# Patient Record
Sex: Male | Born: 2000
Health system: Southern US, Community
[De-identification: ages and names within clinical notes are randomized; demographics above are authoritative.]

## PROBLEM LIST (undated history)

## (undated) DIAGNOSIS — D573 Sickle-cell trait: Secondary | ICD-10-CM

## (undated) HISTORY — PX: NO PAST SURGERIES: SHX2092

## (undated) HISTORY — DX: Sickle-cell trait: D57.3

---
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
## 2022-03-05 ENCOUNTER — Encounter: Payer: 59 | Admitting: Internal Medicine

## 2022-03-10 ENCOUNTER — Encounter: Payer: Self-pay | Admitting: Internal Medicine

## 2022-03-10 ENCOUNTER — Ambulatory Visit (INDEPENDENT_AMBULATORY_CARE_PROVIDER_SITE_OTHER): Payer: 59 | Admitting: Internal Medicine

## 2022-03-10 VITALS — BP 116/84 | HR 74 | Temp 98.4°F | Resp 16 | Ht 76.0 in | Wt 250.4 lb

## 2022-03-10 DIAGNOSIS — Z114 Encounter for screening for human immunodeficiency virus [HIV]: Secondary | ICD-10-CM | POA: Diagnosis not present

## 2022-03-10 DIAGNOSIS — D573 Sickle-cell trait: Secondary | ICD-10-CM

## 2022-03-10 DIAGNOSIS — Z0000 Encounter for general adult medical examination without abnormal findings: Secondary | ICD-10-CM

## 2022-03-10 DIAGNOSIS — E669 Obesity, unspecified: Secondary | ICD-10-CM

## 2022-03-10 DIAGNOSIS — Z1159 Encounter for screening for other viral diseases: Secondary | ICD-10-CM

## 2022-03-10 NOTE — Progress Notes (Signed)
? ?Subjective:  ? ? Patient ID: Blake Greene, male    DOB: [birth year], 21 y.o.   MRN: [id number] ? ?DOS:  03/10/2022 ?Type of visit - description: CPX ? ?Here for CPX, since the last visit he is doing okay. ?He plays football regularly, denies any chest pain, difficulty breathing, palpitations, near syncope or LOC. ? ?Wt Readings from Last 3 Encounters:  ?03/10/22 250 lb 6 oz (113.6 kg) (>99 %, Z= 2.44)*  ?03/04/21 (!) 245 lb (111.1 kg) (>99 %, Z= 2.46)*  ?01/02/20 240 lb 2 oz (108.9 kg) (>99 %, Z= 2.61)*  ? ?* Growth percentiles are based on CDC (Boys, 2-20 Years) data.  ? ? ? ?Review of Systems ? ?Other than above, a 14 point review of systems is negative  ? ?  ? ? ?Past Medical History:  ?Diagnosis Date  ? Sickle cell trait (HCC)   ? ? ?Past Surgical History:  ?Procedure Laterality Date  ? NO PAST SURGERIES    ? ?Social History  ? ?Socioeconomic History  ? Marital status: Single  ?  Spouse name: Not on file  ? Number of children: Not on file  ? Years of education: Not on file  ? Highest education level: Not on file  ?Occupational History  ? Occupation: Holiday representative  in [employer]  ?Tobacco Use  ? Smoking status: Never  ? Smokeless tobacco: Never  ?Substance and Sexual Activity  ? Alcohol use: No  ? Drug use: No  ? Sexual activity: Never  ?Other Topics Concern  ? Not on file  ?Social History Narrative  ? To play football for [employer] this year    ? ?Social Determinants of Health  ? ?Financial Resource Strain: Not on file  ?Food Insecurity: Not on file  ?Transportation Needs: Not on file  ?Physical Activity: Not on file  ?Stress: Not on file  ?Social Connections: Not on file  ?Intimate Partner Violence: Not on file  ? ? ?No current outpatient medications ? ? ?   ?Objective:  ? Physical Exam ?BP 116/84 (BP Location: Left Arm, Patient Position: Sitting, Cuff Size: Normal)   Pulse 74   Temp 98.4 ?F (36.9 ?C) (Oral)   Resp 16   Ht 6\' 4"  (1.93 m)   Wt 250 lb 6 oz (113.6 kg)   SpO2 96%   BMI 30.48 kg/m?   ?General: ?Well developed, NAD, BMI noted ?Neck: No  thyromegaly  ?HEENT:  ?Normocephalic . Face symmetric, atraumatic ?Lungs:  ?CTA B ?Normal respiratory effort, no intercostal retractions, no accessory muscle use. ?Heart: RRR,  no murmur.  ?Abdomen:  ?Not distended, soft, non-tender. No rebound or rigidity.   ?Lower extremities: no pretibial edema bilaterally  ?Skin: Exposed areas without rash. Not pale. Not jaundice ?Neurologic:  ?alert & oriented X3.  ?Speech normal, gait appropriate for age and unassisted ?Strength symmetric and appropriate for age.  ?Psych: ?Cognition and judgment appear intact.  ?Cooperative with normal attention span and concentration.  ?Behavior appropriate. ?No anxious or depressed appearing. ? ?   ?Assessment   ? ? Assessment ?Healthy ? ?Plan: ?Here for CPX ?Weight gain: On purpose, plays football.   ?Check EKG: "Atrial rhythm" per computer reading, but seems to be sinus rythm.  I discussed   informally with cardiology, no worrisome findings on the EKG. ?Sickle cell trait?  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  We will do. ?Social: ?He is a in high school, will play  football for [employer] ?RTC 1 year ? ? ? ?This visit occurred during the SARS-CoV-2 public health emergency.  Safety protocols were in place, including screening questions prior to the visit, additional usage of staff PPE, and extensive cleaning of exam room while observing appropriate contact time as indicated for disinfecting solutions.  ? ?

## 2022-03-10 NOTE — Patient Instructions (Signed)
Consider COVID-vaccine booster ? ?GO TO THE LAB : Get the blood work   ? ? ?GO TO THE FRONT DESK, PLEASE SCHEDULE YOUR APPOINTMENTS ?Come back for   a physical exam in 1 year ? ? ? ? ?Practice self testicular exam once a month ? ? ?Safe Sex ?Practicing safe sex means taking steps before and during sex to reduce your risk of: ?Getting an STI (sexually transmitted infection). ?Giving your partner an STI. ?Unwanted or unplanned pregnancy. ?How to practice safe sex ?Ways you can practice safe sex ? ?Limit your sexual partners to only one partner who is having sex with only you. ?Avoid using alcohol and drugs before having sex. Alcohol and drugs can affect your judgment. ?Before having sex with a new partner: ?Talk to your partner about past partners, past STIs, and drug use. ?Get screened for STIs and discuss the results with your partner. Ask your partner to get screened too. ?Check your body regularly for sores, blisters, rashes, or unusual discharge. If you notice any of these problems, visit your health care provider. ?Avoid sexual contact if you have symptoms of an infection or you are being treated for an STI. ?While having sex, use a condom. Make sure to: ?Use a condom every time you have vaginal, oral, or anal sex. Both females and males should wear condoms during oral sex. ?Keep condoms in place from the beginning to the end of sexual activity. ?Use a latex condom, if possible. Latex condoms offer the best protection. ?Use only water-based lubricants with a condom. Using petroleum-based lubricants or oils will weaken the condom and increase the chance that it will break. ?Ways your health care provider can help you practice safe sex ? ?See your health care provider for regular screenings, exams, and tests for STIs. ?Talk with your health care provider about what kind of birth control (contraception) is best for you. ?Get vaccinated against hepatitis B and human papillomavirus (HPV). ?If you are at risk of  being infected with HIV (human immunodeficiency virus), talk with your health care provider about taking a prescription medicine to prevent HIV infection. You are at risk for HIV if you: ?Are a man who has sex with other men. ?Are sexually active with more than one partner. ?Take drugs by injection. ?Have a sex partner who has HIV. ?Have unprotected sex. ?Have sex with someone who has sex with both men and women. ?Have had an STI. ?Follow these instructions at home: ?Take over-the-counter and prescription medicines only as told by your health care provider. ?Keep all follow-up visits. This is important. ?Where to find more information ?Centers for Disease Control and Prevention: FootballExhibition.com.br ?Planned Parenthood: www.plannedparenthood.org ?Office on Women's Health: http://hoffman.com/ ?Summary ?Practicing safe sex means taking steps before and during sex to reduce your risk getting an STI, giving your partner an STI, and having an unwanted or unplanned pregnancy. ?Before having sex with a new partner, talk to your partner about past partners, past STIs, and drug use. ?Use a condom every time you have vaginal, oral, or anal sex. Both females and males should wear condoms during oral sex. ?Check your body regularly for sores, blisters, rashes, or unusual discharge. If you notice any of these problems, visit your health care provider. ?See your health care provider for regular screenings, exams, and tests for STIs. ?This information is not intended to replace advice given to you by your health care provider. Make sure you discuss any questions you have with your health care provider. ?Document Revised:  04/28/2020 Document Reviewed: 04/28/2020 ?Elsevier Patient Education ? 2022 Elsevier Inc. ? ?

## 2022-03-11 ENCOUNTER — Encounter: Payer: Self-pay | Admitting: Internal Medicine

## 2022-03-11 DIAGNOSIS — Z09 Encounter for follow-up examination after completed treatment for conditions other than malignant neoplasm: Secondary | ICD-10-CM | POA: Insufficient documentation

## 2022-03-11 NOTE — Assessment & Plan Note (Signed)
Here for CPX ?Weight gain: On purpose, plays football.   ?Check EKG: "Atrial rhythm" per computer reading, but seems to be sinus rythm.  I discussed   informally with cardiology, no worrisome findings on the EKG. ?Sickle cell trait?  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  We will do. 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

## 2022-03-11 NOTE — Assessment & Plan Note (Signed)
Tdap 2015 ?Meningococcal vaccine, Bexsero, HPV: series completed ? COVID vax : Booster is an option ?Labs:.Hemoglobin electrophoresis, CMP, CBC, HIV, hep C ?He continues to practice for sports without any problems. ?Patient education: We again discussed safe sex, self testicular exam,  ? ?

## 2022-03-26 ENCOUNTER — Telehealth: Payer: Self-pay | Admitting: Internal Medicine

## 2022-03-26 DIAGNOSIS — Z1159 Encounter for screening for other viral diseases: Secondary | ICD-10-CM

## 2022-03-26 DIAGNOSIS — Z114 Encounter for screening for human immunodeficiency virus [HIV]: Secondary | ICD-10-CM

## 2022-03-26 DIAGNOSIS — Z130 Encounter for screening for diseases of the blood and blood-forming organs and certain disorders involving the immune mechanism: Secondary | ICD-10-CM

## 2022-03-26 DIAGNOSIS — Z0000 Encounter for general adult medical examination without abnormal findings: Secondary | ICD-10-CM

## 2022-03-26 NOTE — Telephone Encounter (Signed)
Patient mother stating patient needs to do lab test for cycle cell ? ?Please advise ?

## 2022-03-26 NOTE — Telephone Encounter (Signed)
Pt was supposed to have labs at his visit on 03/10/22- he did not stop by the lab. Please schedule lab appt at his convenience. Sickle cell screening is included.  ?

## 2022-03-26 NOTE — Addendum Note (Signed)
Addended byConrad Shively D on: 03/26/2022 03:16 PM ? ? Modules accepted: Orders ? ?

## 2022-03-29 ENCOUNTER — Other Ambulatory Visit (INDEPENDENT_AMBULATORY_CARE_PROVIDER_SITE_OTHER): Payer: 59

## 2022-03-29 DIAGNOSIS — Z114 Encounter for screening for human immunodeficiency virus [HIV]: Secondary | ICD-10-CM

## 2022-03-29 DIAGNOSIS — Z0000 Encounter for general adult medical examination without abnormal findings: Secondary | ICD-10-CM

## 2022-03-29 DIAGNOSIS — Z130 Encounter for screening for diseases of the blood and blood-forming organs and certain disorders involving the immune mechanism: Secondary | ICD-10-CM

## 2022-03-29 DIAGNOSIS — Z1159 Encounter for screening for other viral diseases: Secondary | ICD-10-CM

## 2022-03-29 NOTE — Telephone Encounter (Signed)
Lvm to schedule

## 2022-03-30 LAB — COMPREHENSIVE METABOLIC PANEL
ALT: 19 U/L (ref 0–53)
AST: 25 U/L (ref 0–37)
Albumin: 4.6 g/dL (ref 3.5–5.2)
Alkaline Phosphatase: 87 U/L (ref 52–171)
BUN: 8 mg/dL (ref 6–23)
CO2: 27 mEq/L (ref 19–32)
Calcium: 9.6 mg/dL (ref 8.4–10.5)
Chloride: 101 mEq/L (ref 96–112)
Creatinine, Ser: 1.34 mg/dL (ref 0.40–1.50)
GFR: 77.3 mL/min (ref >60.00)
Glucose, Bld: 66 mg/dL — ABNORMAL LOW (ref 70–99)
Potassium: 4.2 mEq/L (ref 3.5–5.1)
Sodium: 134 mEq/L — ABNORMAL LOW (ref 135–145)
Total Bilirubin: 0.8 mg/dL (ref 0.3–1.2)
Total Protein: 7.5 g/dL (ref 6.0–8.3)

## 2022-03-30 LAB — CBC WITH DIFFERENTIAL/PLATELET
Basophils Absolute: 0 10*3/uL (ref 0.0–0.1)
Basophils Relative: 0.4 % (ref 0.0–3.0)
Eosinophils Absolute: 0.1 10*3/uL (ref 0.0–0.7)
Eosinophils Relative: 1 % (ref 0.0–5.0)
HCT: 42.1 % (ref 36.0–49.0)
Hemoglobin: 14.4 g/dL (ref 12.0–16.0)
Lymphocytes Relative: 28.8 % (ref 24.0–48.0)
Lymphs Abs: 1.6 10*3/uL (ref 0.7–4.0)
MCHC: 34.3 g/dL (ref 31.0–37.0)
MCV: 83.1 fl (ref 78.0–98.0)
Monocytes Absolute: 0.3 10*3/uL (ref 0.1–1.0)
Monocytes Relative: 5.2 % (ref 3.0–12.0)
Neutro Abs: 3.7 10*3/uL (ref 1.4–7.7)
Neutrophils Relative %: 64.6 % (ref 43.0–71.0)
Platelets: 204 10*3/uL (ref 150.0–575.0)
RBC: 5.07 Mil/uL (ref 3.80–5.70)
RDW: 14.6 % (ref 11.4–15.5)
WBC: 5.7 10*3/uL (ref 4.5–13.5)

## 2022-03-30 LAB — SICKLE CELL SCREEN: Sickle Solubility Test - HGBRFX: NEGATIVE

## 2022-03-30 LAB — HEPATITIS C ANTIBODY
Hepatitis C Ab: NONREACTIVE
SIGNAL TO CUT-OFF: 0.2 (ref <1.00)

## 2022-03-30 LAB — HIV ANTIBODY (ROUTINE TESTING W REFLEX): HIV 1&2 Ab, 4th Generation: NONREACTIVE

## 2023-03-14 ENCOUNTER — Encounter: Payer: 59 | Admitting: Internal Medicine

## 2023-04-18 ENCOUNTER — Encounter: Payer: Self-pay | Admitting: Internal Medicine

## 2023-04-18 ENCOUNTER — Ambulatory Visit (INDEPENDENT_AMBULATORY_CARE_PROVIDER_SITE_OTHER): Payer: 59 | Admitting: Internal Medicine

## 2023-04-18 VITALS — BP 122/80 | HR 64 | Temp 98.1°F | Resp 16 | Ht 76.0 in | Wt 257.2 lb

## 2023-04-18 DIAGNOSIS — Z0000 Encounter for general adult medical examination without abnormal findings: Secondary | ICD-10-CM | POA: Diagnosis not present

## 2023-04-18 NOTE — Assessment & Plan Note (Signed)
Tdap 2015 Meningococcal vaccine, Bexsero, HPV: series completed Recommend a flu shot every fall Labs:.CMP FLP TSH Patient education: Safe sex, self testicular exam.  Risk of alcohol and drugs discussed as well.

## 2023-04-18 NOTE — Patient Instructions (Addendum)
Vaccines I recommend: Flu shot every fall   GO TO THE LAB : Get the blood work     GO TO THE FRONT DESK, PLEASE SCHEDULE YOUR APPOINTMENTS Come back for a physical exam in 1 year    Safe Sex Practicing safe sex means taking steps before and during sex to reduce your risk of: Getting an STI (sexually transmitted infection). Giving your partner an STI. Unwanted or unplanned pregnancy. How to practice safe sex Ways you can practice safe sex  Limit your sexual partners to only one partner who is having sex with only you. Avoid using alcohol and drugs before having sex. Alcohol and drugs can affect your judgment. Before having sex with a new partner: Talk to your partner about past partners, past STIs, and drug use. Get screened for STIs and discuss the results with your partner. Ask your partner to get screened too. Check your body regularly for sores, blisters, rashes, or unusual discharge. If you notice any of these problems, visit your health care provider. Avoid sexual contact if you have symptoms of an infection or you are being treated for an STI. While having sex, use a condom. Make sure to: Use a condom every time you have vaginal, oral, or anal sex. Both females and males should wear condoms during oral sex. Keep condoms in place from the beginning to the end of sexual activity. Use a latex condom, if possible. Latex condoms offer the best protection. Use only water-based lubricants with a condom. Using petroleum-based lubricants or oils will weaken the condom and increase the chance that it will break. Ways your health care provider can help you practice safe sex  See your health care provider for regular screenings, exams, and tests for STIs. Talk with your health care provider about what kind of birth control (contraception) is best for you. Get vaccinated against hepatitis B and human papillomavirus (HPV). If you are at risk of being infected with HIV (human  immunodeficiency virus), talk with your health care provider about taking a prescription medicine to prevent HIV infection. You are at risk for HIV if you: Are a man who has sex with other men. Are sexually active with more than one partner. Take drugs by injection. Have a sex partner who has HIV. Have unprotected sex. Have sex with someone who has sex with both men and women. Have had an STI. Follow these instructions at home: Take over-the-counter and prescription medicines only as told by your health care provider. Keep all follow-up visits. This is important. Where to find more information Centers for Disease Control and Prevention: FootballExhibition.com.br Planned Parenthood: www.plannedparenthood.org Office on [employer] Health: http://hoffman.com/ Summary Practicing safe sex means taking steps before and during sex to reduce your risk getting an STI, giving your partner an STI, and having an unwanted or unplanned pregnancy. Before having sex with a new partner, talk to your partner about past partners, past STIs, and drug use. Use a condom every time you have vaginal, oral, or anal sex. Both females and males should wear condoms during oral sex. Check your body regularly for sores, blisters, rashes, or unusual discharge. If you notice any of these problems, visit your health care provider. See your health care provider for regular screenings, exams, and tests for STIs. This information is not intended to replace advice given to you by your health care provider. Make sure you discuss any questions you have with your health care provider. Document Revised: 04/28/2020 Document Reviewed: 04/28/2020 Elsevier Patient Education  2023 Elsevier Inc.   Testicular Self-Exam A self-examination of your testicles (testicular self-exam) involves looking at and feeling your testicles for abnormal lumps or swelling. Several things can cause swelling, lumps, or pain in your testicles,  including: Injuries. Inflammation. Infection. Buildup of fluids around the testicle (hydrocele). Twisted testicles (testicular torsion). Testicular cancer. You may be at risk for this if you have: A testicle that has not descended. Previously had testicular cancer. A family history of testicular cancer. General tips It is easiest to do a self-exam during or after a warm bath or shower. Testicles are harder to examine when you are cold because the muscles attached to the testicles retract and pull them up higher or into the abdomen. A normal testicle is egg-shaped and feels firm. It is smooth and not tender. It is normal to feel a firm, spaghetti-like cord at the back of your testicle. This is the spermatic cord. Do a self-exam once a month. How to do a testicular self-exam  Stand and hold your penis away from your body. Look at each testicle to check for changes in appearance, such as swelling or changes in size or shape. Roll each testicle between your thumb and forefinger, feeling the entire testicle. Feel for: Lumps. Swelling. Discomfort. Check for swelling or tender bumps in the groin area. Your groin is where your lower belly (abdomen) meets your upper thighs. Contact a health care provider if: You find a bump or lump. This may be a small, hard bump that is the size of a pea. You have swelling, pain, or soreness in your testicle area. You see or feel any other changes in your testicles. This information is not intended to replace advice given to you by your health care provider. Make sure you discuss any questions you have with your health care provider. Document Revised: 09/06/2022 Document Reviewed: 09/06/2022 Elsevier Patient Education  2023 [employer].

## 2023-04-18 NOTE — Progress Notes (Signed)
   Subjective:    Patient ID: Blake Greene, male    DOB: Feb 14, 2001, 22 y.o.   MRN: [id number]  DOS:  04/18/2023 Type of visit - description: cpx, here with his mother  In general feeling well. Denies any symptoms.   Review of Systems   A 14 point review of systems is negative    History reviewed. No pertinent past medical history.   Past Surgical History:  Procedure Laterality Date   NO PAST SURGERIES     Social History   Socioeconomic History   Marital status: Single    Spouse name: Not on file   Number of children: Not on file   Years of education: Not on file   Highest education level: Not on file  Occupational History   Occupation: [employer]  Tobacco Use   Smoking status: Never   Smokeless tobacco: Never  Substance and Sexual Activity   Alcohol use: No   Drug use: No   Sexual activity: Never  Other Topics Concern   Not on file  Social History Narrative   Plays football for [employer]     Social Determinants of Health   Financial Resource Strain: Not on file  Food Insecurity: Not on file  Transportation Needs: Not on file  Physical Activity: Not on file  Stress: Not on file  Social Connections: Not on file  Intimate Partner Violence: Not on file     No current outpatient medications     Objective:   Physical Exam BP 122/80   Pulse 64   Temp 98.1 F (36.7 C) (Oral)   Resp 16   Ht 6\' 4"  (1.93 m)   Wt 257 lb 4 oz (116.7 kg)   SpO2 97%   BMI 31.31 kg/m  General: Well developed, NAD, BMI noted Neck: No  thyromegaly  HEENT:  Normocephalic . Face symmetric, atraumatic Lungs:  CTA B Normal respiratory effort, no intercostal retractions, no accessory muscle use. Heart: RRR,  no murmur.  Abdomen:  Not distended, soft, non-tender. No rebound or rigidity.   Lower extremities: no pretibial edema bilaterally  Skin: Exposed areas without rash. Not pale. Not jaundice Neurologic:  alert & oriented X3.  Speech normal, gait  appropriate for age and unassisted Strength symmetric and appropriate for age.  Psych: Cognition and judgment appear intact.  Cooperative with normal attention span and concentration.  Behavior appropriate. No anxious or depressed appearing.     Assessment    Assessment  Healthy  PLAN Here for CPX Social: Going to [employer], playing football, doing well academically, denies any problems with anxiety depression or any physical symptoms. Sickle cell trait?  Screening was negative, offered more detail testing but declines.  No FH, the patient and his mother are not interested. RTC 1 year.

## 2023-04-18 NOTE — Assessment & Plan Note (Signed)
Here for CPX Social: Going to [employer], playing football, doing well academically, denies any problems with anxiety depression or any physical symptoms. Sickle cell trait?  Screening was negative, offered more detail testing but declines.  No FH, the patient and his mother are not interested. RTC 1 year.

## 2023-04-19 LAB — COMPREHENSIVE METABOLIC PANEL
ALT: 13 U/L (ref 0–53)
AST: 16 U/L (ref 0–37)
Albumin: 4.5 g/dL (ref 3.5–5.2)
Alkaline Phosphatase: 90 U/L (ref 52–171)
BUN: 11 mg/dL (ref 6–23)
CO2: 26 mEq/L (ref 19–32)
Calcium: 9.8 mg/dL (ref 8.4–10.5)
Chloride: 104 mEq/L (ref 96–112)
Creatinine, Ser: 1.26 mg/dL (ref 0.40–1.50)
GFR: 82.61 mL/min (ref >60.00)
Glucose, Bld: 80 mg/dL (ref 70–99)
Potassium: 4.2 mEq/L (ref 3.5–5.1)
Sodium: 139 mEq/L (ref 135–145)
Total Bilirubin: 0.6 mg/dL (ref 0.2–1.2)
Total Protein: 7.8 g/dL (ref 6.0–8.3)

## 2023-04-19 LAB — LIPID PANEL
Cholesterol: 158 mg/dL (ref 0–200)
HDL: 42.1 mg/dL (ref >39.00)
LDL Cholesterol: 100 mg/dL — ABNORMAL HIGH (ref 0–99)
NonHDL: 116.12
Total CHOL/HDL Ratio: 4
Triglycerides: 81 mg/dL (ref 0.0–149.0)
VLDL: 16.2 mg/dL (ref 0.0–40.0)

## 2023-04-19 LAB — TSH: TSH: 3.63 u[IU]/mL (ref 0.40–5.00)

## 2024-04-18 ENCOUNTER — Ambulatory Visit (INDEPENDENT_AMBULATORY_CARE_PROVIDER_SITE_OTHER): Payer: 59 | Admitting: Internal Medicine

## 2024-04-18 ENCOUNTER — Encounter: Payer: Self-pay | Admitting: Internal Medicine

## 2024-04-18 VITALS — BP 114/72 | HR 78 | Temp 98.0°F | Resp 16 | Ht 76.0 in | Wt 253.4 lb

## 2024-04-18 DIAGNOSIS — Z0000 Encounter for general adult medical examination without abnormal findings: Secondary | ICD-10-CM | POA: Diagnosis not present

## 2024-04-18 DIAGNOSIS — Z23 Encounter for immunization: Secondary | ICD-10-CM | POA: Diagnosis not present

## 2024-04-18 NOTE — Patient Instructions (Addendum)
  Recommend to take a flu shot every fall   GO TO THE LAB : Get the blood work     Next office visit for a physical exam in 1 year Please make an appointment before you leave today

## 2024-04-18 NOTE — Progress Notes (Unsigned)
   Subjective:    Patient ID: Blake Greene, male    DOB: Jun 13, 2001, 23 y.o.   MRN: [id number]  DOS:  04/18/2024 Type of visit - description: cpx, here with his mother  Doing well, has no concerns  Review of Systems See above   No past medical history on file.  Past Surgical History:  Procedure Laterality Date   NO PAST SURGERIES      No current outpatient medications     Objective:   Physical Exam BP 114/72   Pulse 78   Temp 98 F (36.7 C) (Oral)   Resp 16   Ht 6\' 4"  (1.93 m)   Wt 253 lb 6 oz (114.9 kg)   SpO2 95%   BMI 30.84 kg/m  General: Well developed, NAD, BMI noted Neck: No  thyromegaly  HEENT:  Normocephalic . Face symmetric, atraumatic Lungs:  CTA B Normal respiratory effort, no intercostal retractions, no accessory muscle use. Heart: RRR,  no murmur.  Abdomen:  Not distended, soft, non-tender. No rebound or rigidity.   Lower extremities: no pretibial edema bilaterally  Skin: Exposed areas without rash. Not pale. Not jaundice Neurologic:  alert & oriented X3.  Speech normal, gait appropriate for age and unassisted Strength symmetric and appropriate for age.  Psych: Cognition and judgment appear intact.  Cooperative with normal attention span and concentration.  Behavior appropriate. No anxious or depressed appearing.     Assessment    Assessment  Healthy  PLAN Here for CPX Tdap today. Meningococcal vaccine, Bexsero, HPV: series completed Recommend a flu shot every fall Labs:.CMP CBC TSH Patient education: I again reinforced the need for safe sex, self testicular exam. Social: Plays football for [employer] , he trains hard, denies syncope, encouraged good hydration. RTC 1 year

## 2024-04-19 ENCOUNTER — Ambulatory Visit: Payer: Self-pay | Admitting: Internal Medicine

## 2024-04-19 ENCOUNTER — Encounter: Payer: Self-pay | Admitting: Internal Medicine

## 2024-04-19 LAB — COMPREHENSIVE METABOLIC PANEL WITH GFR
ALT: 22 U/L (ref 0–53)
AST: 42 U/L — ABNORMAL HIGH (ref 0–37)
Albumin: 4.5 g/dL (ref 3.5–5.2)
Alkaline Phosphatase: 81 U/L (ref 39–117)
BUN: 10 mg/dL (ref 6–23)
CO2: 25 meq/L (ref 19–32)
Calcium: 9.4 mg/dL (ref 8.4–10.5)
Chloride: 104 meq/L (ref 96–112)
Creatinine, Ser: 1.28 mg/dL (ref 0.40–1.50)
GFR: 80.5 mL/min (ref >60.00)
Glucose, Bld: 79 mg/dL (ref 70–99)
Potassium: 4 meq/L (ref 3.5–5.1)
Sodium: 138 meq/L (ref 135–145)
Total Bilirubin: 0.9 mg/dL (ref 0.2–1.2)
Total Protein: 7.5 g/dL (ref 6.0–8.3)

## 2024-04-19 LAB — CBC WITH DIFFERENTIAL/PLATELET
Basophils Absolute: 0 10*3/uL (ref 0.0–0.1)
Basophils Relative: 0.7 % (ref 0.0–3.0)
Eosinophils Absolute: 0.1 10*3/uL (ref 0.0–0.7)
Eosinophils Relative: 2.6 % (ref 0.0–5.0)
HCT: 42.4 % (ref 39.0–52.0)
Hemoglobin: 14.7 g/dL (ref 13.0–17.0)
Lymphocytes Relative: 36 % (ref 12.0–46.0)
Lymphs Abs: 1.3 10*3/uL (ref 0.7–4.0)
MCHC: 34.6 g/dL (ref 30.0–36.0)
MCV: 84.3 fl (ref 78.0–100.0)
Monocytes Absolute: 0.3 10*3/uL (ref 0.1–1.0)
Monocytes Relative: 7.6 % (ref 3.0–12.0)
Neutro Abs: 2 10*3/uL (ref 1.4–7.7)
Neutrophils Relative %: 53.1 % (ref 43.0–77.0)
Platelets: 210 10*3/uL (ref 150.0–400.0)
RBC: 5.03 Mil/uL (ref 4.22–5.81)
RDW: 13.8 % (ref 11.5–14.6)
WBC: 3.7 10*3/uL — ABNORMAL LOW (ref 4.5–10.5)

## 2024-04-19 LAB — TSH: TSH: 2.49 u[IU]/mL (ref 0.35–5.50)

## 2024-04-19 NOTE — Assessment & Plan Note (Signed)
 Here for CPX, doing well

## 2024-04-19 NOTE — Assessment & Plan Note (Signed)
 Here for CPX Tdap today. Meningococcal vaccine, Bexsero, HPV: series completed Recommend a flu shot every fall Labs:.CMP CBC TSH Patient education: I again reinforced the need for safe sex, self testicular exam. Social: Plays football for [employer] , he trains hard, denies syncope, encouraged good hydration. RTC 1 year

## 2024-05-18 ENCOUNTER — Encounter: Payer: Self-pay | Admitting: Internal Medicine

## 2024-05-18 ENCOUNTER — Ambulatory Visit: Admitting: Internal Medicine

## 2024-09-17 DIAGNOSIS — A549 Gonococcal infection, unspecified: Secondary | ICD-10-CM | POA: Insufficient documentation

## 2024-09-17 DIAGNOSIS — A749 Chlamydial infection, unspecified: Secondary | ICD-10-CM | POA: Insufficient documentation

## 2024-09-17 DIAGNOSIS — N342 Other urethritis: Secondary | ICD-10-CM | POA: Insufficient documentation

## 2024-11-02 ENCOUNTER — Ambulatory Visit: Admission: EM | Admit: 2024-11-02 | Discharge: 2024-11-02 | Disposition: A | Discharge status: Home / Self Care

## 2024-11-02 DIAGNOSIS — Z113 Encounter for screening for infections with a predominantly sexual mode of transmission: Secondary | ICD-10-CM | POA: Insufficient documentation

## 2024-11-02 DIAGNOSIS — Z202 Contact with and (suspected) exposure to infections with a predominantly sexual mode of transmission: Secondary | ICD-10-CM | POA: Diagnosis present

## 2024-11-02 MED ORDER — CEFTRIAXONE SODIUM 500 MG IJ SOLR
500.0000 mg | INTRAMUSCULAR | Status: DC
  Administered 2024-11-02: 500 mg via INTRAMUSCULAR

## 2024-11-02 MED ORDER — DOXYCYCLINE HYCLATE 100 MG PO CAPS: 100.0000 mg | ORAL_CAPSULE | Freq: Two times a day (BID) | ORAL | 0 refills | Status: AC

## 2024-11-02 NOTE — Discharge Instructions (Signed)
 You were tested for STIs today. You should receive your results in 3-5 days. If you have not received a call from the office or see results in your mychart please call the clinic where you were seen. It is best if your refrain from sexual activity until you get results back. If positive you will need to refrain from sexual activity for 2 weeks and be sure to complete any antibiotics prescribed in their entirety. '

## 2024-11-02 NOTE — ED Triage Notes (Signed)
 Pt reports burning and penile discharge x 2 days. Would like STI testing.

## 2024-11-02 NOTE — ED Provider Notes (Signed)
 [facility]    CSN: [id number] Arrival date & time: 11/02/24  1735      History   Chief Complaint Chief Complaint  Patient presents with   SEXUALLY TRANSMITTED DISEASE    HPI Blake Greene is a 23 y.o. male.   Pt presents today due to dysuria and penile discharge for the past 2 days. Pt states that he is sexually active but does not use protection during all sexual encounters. Pt states that last sexual encounter was last week.   The history is provided by the patient.    History reviewed. No pertinent past medical history.  Patient Active Problem List   Diagnosis Date Noted   Chlamydia 09/17/2024   Gonorrhea 09/17/2024   Urethritis 09/17/2024   PCP NOTES >>>>>>>>>>>>>>>>> 03/11/2022   Annual physical exam 01/03/2020    Past Surgical History:  Procedure Laterality Date   NO PAST SURGERIES         Home Medications    Prior to Admission medications   Medication Sig Start Date End Date Taking? Authorizing Provider  predniSONE (STERAPRED UNI-PAK 21 TAB) 10 MG (21) TBPK tablet Day 1: 2 at bfst 1 at lun 1 at din 2 at bed Day 2: 1 at bfst 1 at lun 1 at din 2 at bd Day 3: 1 at bfst 1 at lun 1 at din 1 at bd Day 4: 1 at bfst 1 at lun 1 at bd Day 5: 1 at bfst 1 at bd Day 6: 1 at bfst 10/17/23   [provider]    Family History Family History  Problem Relation Age of Onset   Hypertension Mother    Diabetes Paternal Grandmother    Colon cancer Neg Hx    Prostate cancer Neg Hx    CAD Neg Hx     Social History Social History   Tobacco Use   Smoking status: Never   Smokeless tobacco: Never  Substance Use Topics   Alcohol use: No   Drug use: No     Allergies   Patient has no known allergies.   Review of Systems Review of Systems   Physical Exam Triage Vital Signs ED Triage Vitals [11/02/24 1750]  Encounter Vitals Group     BP 138/83     Girls Systolic BP Percentile      Girls Diastolic BP Percentile      Boys Systolic  BP Percentile      Boys Diastolic BP Percentile      Pulse Rate 70     Resp 18     Temp (!) 97.3 F (36.3 C)     Temp Source Oral     SpO2 96 %     Weight      Height      Head Circumference      Peak Flow      Pain Score      Pain Loc      Pain Education      Exclude from Growth Chart    No data found.  Updated Vital Signs BP 138/83 (BP Location: Left Arm)   Pulse 70   Temp (!) 97.3 F (36.3 C) (Oral)   Resp 18   SpO2 96%   Visual Acuity Right Eye Distance:   Left Eye Distance:   Bilateral Distance:    Right Eye Near:   Left Eye Near:    Bilateral Near:     Physical Exam Vitals and nursing note reviewed.  Constitutional:  General: He is not in acute distress.    Appearance: Normal appearance. He is not ill-appearing, toxic-appearing or diaphoretic.  Eyes:     General: No scleral icterus. Cardiovascular:     Rate and Rhythm: Normal rate and regular rhythm.     Heart sounds: Normal heart sounds.  Pulmonary:     Effort: Pulmonary effort is normal. No respiratory distress.     Breath sounds: Normal breath sounds. No wheezing or rhonchi.  Skin:    General: Skin is warm.  Neurological:     Mental Status: He is alert and oriented to person, place, and time.  Psychiatric:        Mood and Affect: Mood normal.        Behavior: Behavior normal.      UC Treatments / Results  Labs (all labs ordered are listed, but only abnormal results are displayed) Labs Reviewed - No data to display  EKG   Radiology No results found.  Procedures Procedures (including critical care time)  Medications Ordered in UC Medications  cefTRIAXone (ROCEPHIN) injection 500 mg (500 mg Intramuscular Given 11/02/24 1819)    Initial Impression / Assessment and Plan / UC Course  I have reviewed the triage vital signs and the nursing notes.  Pertinent labs & imaging results that were available during my care of the patient were reviewed by me and considered in my medical  decision making (see chart for details).    Final Clinical Impressions(s) / UC Diagnoses   Final diagnoses:  None   Discharge Instructions   None    ED Prescriptions   None    PDMP not reviewed this encounter.   Andra Corean BROCKS, PA-C 11/02/24 1823

## 2024-11-07 ENCOUNTER — Ambulatory Visit (HOSPITAL_COMMUNITY): Payer: Self-pay

## 2024-11-07 LAB — CYTOLOGY, (ORAL, ANAL, URETHRAL) ANCILLARY ONLY
Chlamydia: NEGATIVE
Comment: NEGATIVE
Comment: NEGATIVE
Comment: NORMAL
Neisseria Gonorrhea: POSITIVE — AB
Trichomonas: NEGATIVE

## 2025-04-23 ENCOUNTER — Encounter: Admitting: Internal Medicine
